# Patient Record
Sex: Female | Born: 1950 | Race: White | Hispanic: No | Marital: Married | Smoking: Current every day smoker
Health system: Southern US, Community
[De-identification: ages and names within clinical notes are randomized; demographics above are authoritative.]

## PROBLEM LIST (undated history)

## (undated) DIAGNOSIS — I509 Heart failure, unspecified: Secondary | ICD-10-CM

## (undated) DIAGNOSIS — M1990 Unspecified osteoarthritis, unspecified site: Secondary | ICD-10-CM

## (undated) DIAGNOSIS — I10 Essential (primary) hypertension: Secondary | ICD-10-CM

## (undated) HISTORY — PX: ABDOMINAL HYSTERECTOMY: SHX81

## (undated) HISTORY — PX: BACK SURGERY: SHX140

---

## 2015-03-02 ENCOUNTER — Emergency Department (HOSPITAL_COMMUNITY): Payer: No Typology Code available for payment source

## 2015-03-02 ENCOUNTER — Encounter (HOSPITAL_COMMUNITY): Payer: Self-pay | Admitting: Emergency Medicine

## 2015-03-02 ENCOUNTER — Emergency Department (HOSPITAL_COMMUNITY)
Admission: EM | Admit: 2015-03-02 | Discharge: 2015-03-02 | Disposition: A | Discharge status: Home / Self Care | Payer: No Typology Code available for payment source | Attending: Emergency Medicine | Admitting: Emergency Medicine

## 2015-03-02 DIAGNOSIS — S199XXA Unspecified injury of neck, initial encounter: Secondary | ICD-10-CM | POA: Insufficient documentation

## 2015-03-02 DIAGNOSIS — Z880 Allergy status to penicillin: Secondary | ICD-10-CM | POA: Diagnosis not present

## 2015-03-02 DIAGNOSIS — Y998 Other external cause status: Secondary | ICD-10-CM | POA: Diagnosis not present

## 2015-03-02 DIAGNOSIS — Y92410 Unspecified street and highway as the place of occurrence of the external cause: Secondary | ICD-10-CM | POA: Insufficient documentation

## 2015-03-02 DIAGNOSIS — I10 Essential (primary) hypertension: Secondary | ICD-10-CM | POA: Diagnosis not present

## 2015-03-02 DIAGNOSIS — S299XXA Unspecified injury of thorax, initial encounter: Secondary | ICD-10-CM | POA: Diagnosis present

## 2015-03-02 DIAGNOSIS — Z720 Tobacco use: Secondary | ICD-10-CM | POA: Diagnosis not present

## 2015-03-02 DIAGNOSIS — I509 Heart failure, unspecified: Secondary | ICD-10-CM | POA: Diagnosis not present

## 2015-03-02 DIAGNOSIS — Y9389 Activity, other specified: Secondary | ICD-10-CM | POA: Insufficient documentation

## 2015-03-02 DIAGNOSIS — S3991XA Unspecified injury of abdomen, initial encounter: Secondary | ICD-10-CM | POA: Insufficient documentation

## 2015-03-02 DIAGNOSIS — S20211A Contusion of right front wall of thorax, initial encounter: Secondary | ICD-10-CM | POA: Insufficient documentation

## 2015-03-02 DIAGNOSIS — Z8739 Personal history of other diseases of the musculoskeletal system and connective tissue: Secondary | ICD-10-CM | POA: Insufficient documentation

## 2015-03-02 DIAGNOSIS — S301XXA Contusion of abdominal wall, initial encounter: Secondary | ICD-10-CM

## 2015-03-02 HISTORY — DX: Heart failure, unspecified: I50.9

## 2015-03-02 HISTORY — DX: Essential (primary) hypertension: I10

## 2015-03-02 HISTORY — DX: Unspecified osteoarthritis, unspecified site: M19.90

## 2015-03-02 LAB — CBC WITH DIFFERENTIAL/PLATELET
BASOS PCT: 0 % (ref 0–1)
Basophils Absolute: 0 10*3/uL (ref 0.0–0.1)
EOS ABS: 0.2 10*3/uL (ref 0.0–0.7)
Eosinophils Relative: 4 % (ref 0–5)
HEMATOCRIT: 39.4 % (ref 36.0–46.0)
HEMOGLOBIN: 12 g/dL (ref 12.0–15.0)
Lymphocytes Relative: 35 % (ref 12–46)
Lymphs Abs: 2.2 10*3/uL (ref 0.7–4.0)
MCH: 27.3 pg (ref 26.0–34.0)
MCHC: 30.5 g/dL (ref 30.0–36.0)
MCV: 89.5 fL (ref 78.0–100.0)
MONOS PCT: 8 % (ref 3–12)
Monocytes Absolute: 0.5 10*3/uL (ref 0.1–1.0)
NEUTROS ABS: 3.3 10*3/uL (ref 1.7–7.7)
NEUTROS PCT: 53 % (ref 43–77)
Platelets: 233 10*3/uL (ref 150–400)
RBC: 4.4 MIL/uL (ref 3.87–5.11)
RDW: 14.1 % (ref 11.5–15.5)
WBC: 6.3 10*3/uL (ref 4.0–10.5)

## 2015-03-02 LAB — PROTIME-INR
INR: 1.08 (ref 0.00–1.49)
Prothrombin Time: 14.2 seconds (ref 11.6–15.2)

## 2015-03-02 LAB — I-STAT CHEM 8, ED
BUN: 18 mg/dL (ref 6–20)
CHLORIDE: 104 mmol/L (ref 101–111)
CREATININE: 0.9 mg/dL (ref 0.44–1.00)
Calcium, Ion: 1.13 mmol/L (ref 1.13–1.30)
Glucose, Bld: 121 mg/dL — ABNORMAL HIGH (ref 65–99)
HEMATOCRIT: 39 % (ref 36.0–46.0)
Hemoglobin: 13.3 g/dL (ref 12.0–15.0)
POTASSIUM: 4.1 mmol/L (ref 3.5–5.1)
SODIUM: 139 mmol/L (ref 135–145)
TCO2: 25 mmol/L (ref 0–100)

## 2015-03-02 MED ORDER — FENTANYL CITRATE (PF) 100 MCG/2ML IJ SOLN
50.0000 ug | Freq: Once | INTRAMUSCULAR | Status: AC
  Administered 2015-03-02: 50 ug via INTRAVENOUS
  Filled 2015-03-02: qty 2

## 2015-03-02 MED ORDER — HYDROCODONE-ACETAMINOPHEN 5-325 MG PO TABS: 2.0000 | ORAL_TABLET | ORAL | Status: AC | PRN

## 2015-03-02 MED ORDER — SODIUM CHLORIDE 0.9 % IV SOLN
Freq: Once | INTRAVENOUS | Status: AC
  Administered 2015-03-02: 01:00:00 via INTRAVENOUS

## 2015-03-02 MED ORDER — ONDANSETRON HCL 4 MG/2ML IJ SOLN
4.0000 mg | Freq: Once | INTRAMUSCULAR | Status: AC
  Administered 2015-03-02: 4 mg via INTRAVENOUS
  Filled 2015-03-02: qty 2

## 2015-03-02 MED ORDER — IOHEXOL 300 MG/ML  SOLN
100.0000 mL | Freq: Once | INTRAMUSCULAR | Status: AC | PRN
  Administered 2015-03-02: 100 mL via INTRAVENOUS
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
## 2019-10-13 DEATH — deceased
